# Patient Record
Sex: Female | Born: 1975 | Hispanic: Yes | Marital: Married | Smoking: Never smoker
Health system: Southern US, Community
[De-identification: ages and names within clinical notes are randomized; demographics above are authoritative.]

## PROBLEM LIST (undated history)

## (undated) DIAGNOSIS — R202 Paresthesia of skin: Secondary | ICD-10-CM

## (undated) DIAGNOSIS — M2570 Osteophyte, unspecified joint: Secondary | ICD-10-CM

## (undated) DIAGNOSIS — M9902 Segmental and somatic dysfunction of thoracic region: Secondary | ICD-10-CM

## (undated) DIAGNOSIS — M5413 Radiculopathy, cervicothoracic region: Secondary | ICD-10-CM

## (undated) DIAGNOSIS — M5414 Radiculopathy, thoracic region: Secondary | ICD-10-CM

## (undated) DIAGNOSIS — M9901 Segmental and somatic dysfunction of cervical region: Secondary | ICD-10-CM

## (undated) DIAGNOSIS — M545 Low back pain, unspecified: Secondary | ICD-10-CM

## (undated) DIAGNOSIS — M9903 Segmental and somatic dysfunction of lumbar region: Secondary | ICD-10-CM

## (undated) HISTORY — DX: Paresthesia of skin: R20.2

## (undated) HISTORY — DX: Segmental and somatic dysfunction of thoracic region: M99.02

## (undated) HISTORY — DX: Segmental and somatic dysfunction of cervical region: M99.01

## (undated) HISTORY — DX: Radiculopathy, cervicothoracic region: M54.13

## (undated) HISTORY — DX: Radiculopathy, thoracic region: M54.14

## (undated) HISTORY — DX: Segmental and somatic dysfunction of lumbar region: M99.03

## (undated) HISTORY — DX: Low back pain, unspecified: M54.50

## (undated) HISTORY — DX: Osteophyte, unspecified joint: M25.70

---

## 2011-07-15 ENCOUNTER — Other Ambulatory Visit: Payer: Self-pay | Admitting: Family Medicine

## 2011-07-15 ENCOUNTER — Other Ambulatory Visit (HOSPITAL_COMMUNITY)
Admission: RE | Admit: 2011-07-15 | Discharge: 2011-07-15 | Disposition: A | Discharge status: Home / Self Care | Payer: BC Managed Care – PPO | Source: Ambulatory Visit | Attending: Family Medicine | Admitting: Family Medicine

## 2011-07-15 DIAGNOSIS — Z1159 Encounter for screening for other viral diseases: Secondary | ICD-10-CM | POA: Insufficient documentation

## 2011-07-15 DIAGNOSIS — E049 Nontoxic goiter, unspecified: Secondary | ICD-10-CM

## 2011-07-15 DIAGNOSIS — Z124 Encounter for screening for malignant neoplasm of cervix: Secondary | ICD-10-CM | POA: Insufficient documentation

## 2012-07-17 ENCOUNTER — Other Ambulatory Visit (HOSPITAL_COMMUNITY)
Admission: RE | Admit: 2012-07-17 | Discharge: 2012-07-17 | Disposition: A | Discharge status: Home / Self Care | Payer: BC Managed Care – PPO | Source: Ambulatory Visit | Attending: Family Medicine | Admitting: Family Medicine

## 2012-07-17 ENCOUNTER — Other Ambulatory Visit: Payer: Self-pay | Admitting: Family Medicine

## 2012-07-17 DIAGNOSIS — Z124 Encounter for screening for malignant neoplasm of cervix: Secondary | ICD-10-CM | POA: Insufficient documentation

## 2015-08-15 ENCOUNTER — Other Ambulatory Visit (HOSPITAL_COMMUNITY)
Admission: RE | Admit: 2015-08-15 | Discharge: 2015-08-15 | Disposition: A | Discharge status: Home / Self Care | Payer: BC Managed Care – PPO | Source: Ambulatory Visit | Attending: Family Medicine | Admitting: Family Medicine

## 2015-08-15 ENCOUNTER — Other Ambulatory Visit: Payer: Self-pay | Admitting: Family Medicine

## 2015-08-15 DIAGNOSIS — Z01411 Encounter for gynecological examination (general) (routine) with abnormal findings: Secondary | ICD-10-CM | POA: Diagnosis present

## 2015-08-15 DIAGNOSIS — Z1151 Encounter for screening for human papillomavirus (HPV): Secondary | ICD-10-CM | POA: Insufficient documentation

## 2015-08-16 LAB — CYTOLOGY - PAP

## 2016-01-12 ENCOUNTER — Other Ambulatory Visit: Payer: Self-pay | Admitting: Family Medicine

## 2016-01-12 DIAGNOSIS — Z1231 Encounter for screening mammogram for malignant neoplasm of breast: Secondary | ICD-10-CM

## 2016-01-24 ENCOUNTER — Ambulatory Visit
Admission: RE | Admit: 2016-01-24 | Discharge: 2016-01-24 | Disposition: A | Discharge status: Home / Self Care | Payer: BC Managed Care – PPO | Source: Ambulatory Visit | Attending: Family Medicine | Admitting: Family Medicine

## 2016-01-24 DIAGNOSIS — Z1231 Encounter for screening mammogram for malignant neoplasm of breast: Secondary | ICD-10-CM

## 2017-01-07 ENCOUNTER — Other Ambulatory Visit: Payer: Self-pay | Admitting: Family Medicine

## 2017-01-07 DIAGNOSIS — Z1231 Encounter for screening mammogram for malignant neoplasm of breast: Secondary | ICD-10-CM

## 2017-01-28 ENCOUNTER — Ambulatory Visit
Admission: RE | Admit: 2017-01-28 | Discharge: 2017-01-28 | Disposition: A | Discharge status: Home / Self Care | Payer: BC Managed Care – PPO | Source: Ambulatory Visit | Attending: Family Medicine | Admitting: Family Medicine

## 2017-01-28 DIAGNOSIS — Z1231 Encounter for screening mammogram for malignant neoplasm of breast: Secondary | ICD-10-CM

## 2017-01-29 ENCOUNTER — Other Ambulatory Visit: Payer: Self-pay | Admitting: Family Medicine

## 2017-01-29 DIAGNOSIS — R928 Other abnormal and inconclusive findings on diagnostic imaging of breast: Secondary | ICD-10-CM

## 2017-02-04 ENCOUNTER — Ambulatory Visit
Admission: RE | Admit: 2017-02-04 | Discharge: 2017-02-04 | Disposition: A | Discharge status: Home / Self Care | Payer: BC Managed Care – PPO | Source: Ambulatory Visit | Attending: Family Medicine | Admitting: Family Medicine

## 2017-02-04 DIAGNOSIS — R928 Other abnormal and inconclusive findings on diagnostic imaging of breast: Secondary | ICD-10-CM

## 2018-03-04 ENCOUNTER — Other Ambulatory Visit: Payer: Self-pay | Admitting: Family Medicine

## 2018-03-04 DIAGNOSIS — Z1231 Encounter for screening mammogram for malignant neoplasm of breast: Secondary | ICD-10-CM

## 2018-04-10 ENCOUNTER — Ambulatory Visit
Admission: RE | Admit: 2018-04-10 | Discharge: 2018-04-10 | Disposition: A | Discharge status: Home / Self Care | Payer: BC Managed Care – PPO | Source: Ambulatory Visit | Attending: Family Medicine | Admitting: Family Medicine

## 2018-04-10 DIAGNOSIS — Z1231 Encounter for screening mammogram for malignant neoplasm of breast: Secondary | ICD-10-CM

## 2018-04-14 ENCOUNTER — Other Ambulatory Visit: Payer: Self-pay | Admitting: Family Medicine

## 2018-04-14 DIAGNOSIS — R928 Other abnormal and inconclusive findings on diagnostic imaging of breast: Secondary | ICD-10-CM

## 2018-04-22 ENCOUNTER — Ambulatory Visit
Admission: RE | Admit: 2018-04-22 | Discharge: 2018-04-22 | Disposition: A | Discharge status: Home / Self Care | Payer: BC Managed Care – PPO | Source: Ambulatory Visit | Attending: Family Medicine | Admitting: Family Medicine

## 2018-04-22 DIAGNOSIS — R928 Other abnormal and inconclusive findings on diagnostic imaging of breast: Secondary | ICD-10-CM

## 2019-09-15 ENCOUNTER — Other Ambulatory Visit: Payer: Self-pay | Admitting: Family Medicine

## 2019-09-15 ENCOUNTER — Ambulatory Visit
Admission: RE | Admit: 2019-09-15 | Discharge: 2019-09-15 | Disposition: A | Discharge status: Home / Self Care | Payer: BC Managed Care – PPO | Source: Ambulatory Visit | Attending: Family Medicine | Admitting: Family Medicine

## 2019-09-15 DIAGNOSIS — M549 Dorsalgia, unspecified: Secondary | ICD-10-CM

## 2019-09-15 DIAGNOSIS — G8929 Other chronic pain: Secondary | ICD-10-CM

## 2019-12-28 ENCOUNTER — Other Ambulatory Visit: Payer: Self-pay | Admitting: Family Medicine

## 2019-12-28 DIAGNOSIS — Z1231 Encounter for screening mammogram for malignant neoplasm of breast: Secondary | ICD-10-CM

## 2020-01-28 ENCOUNTER — Ambulatory Visit
Admission: RE | Admit: 2020-01-28 | Discharge: 2020-01-28 | Disposition: A | Discharge status: Home / Self Care | Payer: BC Managed Care – PPO | Source: Ambulatory Visit | Attending: Family Medicine | Admitting: Family Medicine

## 2020-01-28 ENCOUNTER — Other Ambulatory Visit: Payer: Self-pay

## 2020-01-28 DIAGNOSIS — Z1231 Encounter for screening mammogram for malignant neoplasm of breast: Secondary | ICD-10-CM

## 2020-02-09 ENCOUNTER — Other Ambulatory Visit: Payer: Self-pay | Admitting: Chiropractic Medicine

## 2020-02-09 ENCOUNTER — Other Ambulatory Visit: Payer: Self-pay

## 2020-02-09 ENCOUNTER — Ambulatory Visit
Admission: RE | Admit: 2020-02-09 | Discharge: 2020-02-09 | Disposition: A | Discharge status: Home / Self Care | Payer: BC Managed Care – PPO | Source: Ambulatory Visit | Attending: Chiropractic Medicine | Admitting: Chiropractic Medicine

## 2020-02-09 DIAGNOSIS — M549 Dorsalgia, unspecified: Secondary | ICD-10-CM

## 2020-04-19 ENCOUNTER — Other Ambulatory Visit: Payer: Self-pay | Admitting: Chiropractic Medicine

## 2020-04-19 ENCOUNTER — Ambulatory Visit
Admission: RE | Admit: 2020-04-19 | Discharge: 2020-04-19 | Disposition: A | Discharge status: Home / Self Care | Payer: BC Managed Care – PPO | Source: Ambulatory Visit | Attending: Chiropractic Medicine | Admitting: Chiropractic Medicine

## 2020-04-19 ENCOUNTER — Other Ambulatory Visit: Payer: Self-pay

## 2020-04-19 DIAGNOSIS — R208 Other disturbances of skin sensation: Secondary | ICD-10-CM

## 2020-08-01 ENCOUNTER — Encounter: Payer: Self-pay | Admitting: *Deleted

## 2020-08-01 ENCOUNTER — Other Ambulatory Visit: Payer: Self-pay | Admitting: *Deleted

## 2020-08-02 ENCOUNTER — Ambulatory Visit: Payer: BC Managed Care – PPO | Admitting: Diagnostic Neuroimaging

## 2020-09-28 ENCOUNTER — Encounter: Payer: Self-pay | Admitting: *Deleted

## 2020-10-03 ENCOUNTER — Ambulatory Visit: Payer: BC Managed Care – PPO | Admitting: Diagnostic Neuroimaging

## 2020-10-04 ENCOUNTER — Ambulatory Visit: Payer: BC Managed Care – PPO | Admitting: Diagnostic Neuroimaging

## 2020-11-21 ENCOUNTER — Other Ambulatory Visit: Payer: Self-pay

## 2020-11-21 ENCOUNTER — Encounter: Payer: Self-pay | Admitting: Diagnostic Neuroimaging

## 2020-11-21 ENCOUNTER — Ambulatory Visit: Payer: BC Managed Care – PPO | Admitting: Diagnostic Neuroimaging

## 2020-11-21 VITALS — BP 109/71 | HR 100 | Ht 61.0 in | Wt 184.0 lb

## 2020-11-21 DIAGNOSIS — M546 Pain in thoracic spine: Secondary | ICD-10-CM

## 2020-11-21 DIAGNOSIS — G8929 Other chronic pain: Secondary | ICD-10-CM | POA: Diagnosis not present

## 2020-11-21 DIAGNOSIS — R200 Anesthesia of skin: Secondary | ICD-10-CM | POA: Diagnosis not present

## 2020-11-21 NOTE — Progress Notes (Signed)
[facility]  PATIENT: Kayla Wheeler DOB: January 21, 1976  REFERRING CLINICIAN: Geanie Logan, DC HISTORY FROM: patient  REASON FOR VISIT: new consult    HISTORICAL  CHIEF COMPLAINT:  Chief Complaint  Patient presents with   Electric shock sensations, thoracic spine    Rm 7 New Pt  husband- Hildred Alamin  "pain began a year ago, no hx of accident"     HISTORY OF PRESENT ILLNESS:   45 year old female here for valuation of left posterior thoracic pain.  Symptoms started around March 2021.  She saw orthopedic clinic, pain management specialist, chiropractor, physical therapist and PCP.  No specific etiology has been found.  She feels more pain when she is sitting or moving her arm especially overhead.  Is better if she is standing up with her arms at her side.  She has tried gabapentin without relief.  No prodromal accidents injuries or traumas.   REVIEW OF SYSTEMS: Full 14 system review of systems performed and negative with exception of: as per HPI.  ALLERGIES: No Known Allergies  HOME MEDICATIONS: Outpatient Medications Prior to Visit  Medication Sig Dispense Refill   gabapentin (NEURONTIN) 300 MG capsule Take 300 mg by mouth 3 (three) times daily.     No facility-administered medications prior to visit.    PAST MEDICAL HISTORY: Past Medical History:  Diagnosis Date   Lumbar pain    Osteophyte    Paresthesia    Radiculopathy, cervicothoracic region    Radiculopathy, thoracic region    Segmental and somatic dysfunction of cervical region    Segmental and somatic dysfunction of lumbar region    Segmental and somatic dysfunction of thoracic region     PAST SURGICAL HISTORY: No past surgical history on file.  FAMILY HISTORY: Family History  Problem Relation Age of Onset   Heart disease Mother    Breast cancer Neg Hx     SOCIAL HISTORY: Social History   Socioeconomic History   Marital status: Married    Spouse name: Hildred Alamin   Number  of children: 2   Years of education: Not on file   Highest education level: Bachelor's degree (e.g., BA, AB, BS)  Occupational History   Not on file  Tobacco Use   Smoking status: Never   Smokeless tobacco: Never  Substance and Sexual Activity   Alcohol use: Yes    Comment: occas   Drug use: Never   Sexual activity: Not on file  Other Topics Concern   Not on file  Social History Narrative   Lives with husband   Social Determinants of Health   Financial Resource Strain: Not on file  Food Insecurity: Not on file  Transportation Needs: Not on file  Physical Activity: Not on file  Stress: Not on file  Social Connections: Not on file  Intimate Partner Violence: Not on file     PHYSICAL EXAM  GENERAL EXAM/CONSTITUTIONAL: Vitals:  Vitals:   11/21/20 1448  BP: 109/71  Pulse: 100  Weight: 184 lb (83.5 kg)  Height: 5\' 1"  (1.549 m)   Body mass index is 34.77 kg/m. Wt Readings from Last 3 Encounters:  11/21/20 184 lb (83.5 kg)   Patient is in no distress; well developed, nourished and groomed; neck is supple  CARDIOVASCULAR: Examination of carotid arteries is normal; no carotid bruits Regular rate and rhythm, no murmurs Examination of peripheral vascular system by observation and palpation is normal  EYES: Ophthalmoscopic exam of optic discs and posterior segments is normal; no papilledema or  hemorrhages No results found.  MUSCULOSKELETAL: Gait, strength, tone, movements noted in Neurologic exam below  NEUROLOGIC: MENTAL STATUS:  No flowsheet data found. awake, alert, oriented to person, place and time recent and remote memory intact normal attention and concentration language fluent, comprehension intact, naming intact fund of knowledge appropriate  CRANIAL NERVE:  2nd - no papilledema on fundoscopic exam 2nd, 3rd, 4th, 6th - pupils equal and reactive to light, visual fields full to confrontation, extraocular muscles intact, no nystagmus 5th - facial  sensation symmetric 7th - facial strength symmetric 8th - hearing intact 9th - palate elevates symmetrically, uvula midline 11th - shoulder shrug symmetric 12th - tongue protrusion midline  MOTOR:  normal bulk and tone, full strength in the BUE, BLE  SENSORY:  normal and symmetric to light touch, temperature, vibration  COORDINATION:  finger-nose-finger, fine finger movements normal  REFLEXES:  deep tendon reflexes 2+ and symmetric  GAIT/STATION:  narrow based gait     DIAGNOSTIC DATA (LABS, IMAGING, TESTING) - I reviewed patient records, labs, notes, testing and imaging myself where available.  No results found for: WBC, HGB, HCT, MCV, PLT No results found for: NA, K, CL, CO2, GLUCOSE, BUN, CREATININE, CALCIUM, PROT, ALBUMIN, AST, ALT, ALKPHOS, BILITOT, GFRNONAA, GFRAA No results found for: CHOL, HDL, LDLCALC, LDLDIRECT, TRIG, CHOLHDL No results found for: WPYK9X No results found for: VITAMINB12 No results found for: TSH   12/21/19 MRI thoracic spine  - unremarkable     ASSESSMENT AND PLAN  45 y.o. year old female here with chronic pain and numbness in left posterior thoracic region, likely representing thoracic paraspinal and latissimus muscle strain and referred symptoms.  Dx:  1. Chronic left-sided thoracic back pain   2. Numbness       PLAN:  LEFT THORACIC PARASPINAL / LATISSIMUS MUSCLE STRAIN - check B12 level - try acupuncture or massage therapy - start gradual exercise program [facility])  Orders Placed This Encounter  Procedures   Vitamin B12   Return for return to PCP, pending if symptoms worsen or fail to improve, pending test results.    Suanne Marker, MD 11/21/2020, 3:31 PM Certified in Neurology, Neurophysiology and Neuroimaging  [facility] [address], [address] [place], [place] [address] [phone]

## 2020-11-21 NOTE — Patient Instructions (Signed)
-   check B12 level - try acupuncture or massage therapy - start gradual exercise program [facility])

## 2020-11-22 LAB — VITAMIN B12: Vitamin B-12: 433 pg/mL (ref 232–1245)

## 2020-11-23 ENCOUNTER — Telehealth: Payer: Self-pay

## 2020-11-23 NOTE — Telephone Encounter (Signed)
I called the pt and advised of normal lab. Pt advised to call back if she had any questions.

## 2020-11-23 NOTE — Telephone Encounter (Signed)
-----   Message from Suanne Marker, MD sent at 11/22/2020  5:25 PM EDT ----- Normal labs. Please call patient. -VRP

## 2021-04-19 ENCOUNTER — Other Ambulatory Visit: Payer: Self-pay | Admitting: Orthopedic Surgery

## 2021-04-19 DIAGNOSIS — M5030 Other cervical disc degeneration, unspecified cervical region: Secondary | ICD-10-CM

## 2021-04-19 DIAGNOSIS — M4802 Spinal stenosis, cervical region: Secondary | ICD-10-CM

## 2021-05-06 ENCOUNTER — Other Ambulatory Visit: Payer: BC Managed Care – PPO

## 2021-06-27 ENCOUNTER — Other Ambulatory Visit: Payer: Self-pay | Admitting: Family Medicine

## 2021-06-27 DIAGNOSIS — Z1231 Encounter for screening mammogram for malignant neoplasm of breast: Secondary | ICD-10-CM

## 2021-07-11 ENCOUNTER — Ambulatory Visit
Admission: RE | Admit: 2021-07-11 | Discharge: 2021-07-11 | Disposition: A | Discharge status: Home / Self Care | Payer: BC Managed Care – PPO | Source: Ambulatory Visit

## 2021-07-11 DIAGNOSIS — Z1231 Encounter for screening mammogram for malignant neoplasm of breast: Secondary | ICD-10-CM

## 2021-07-20 IMAGING — CR DG PELVIS 1-2V
1 series · 1 of 1 positions shown · non-contrast
Comparison: None.

CLINICAL DATA: Back pain.

EXAM:
PELVIS - 1-2 VIEW

[w pelvis upright]
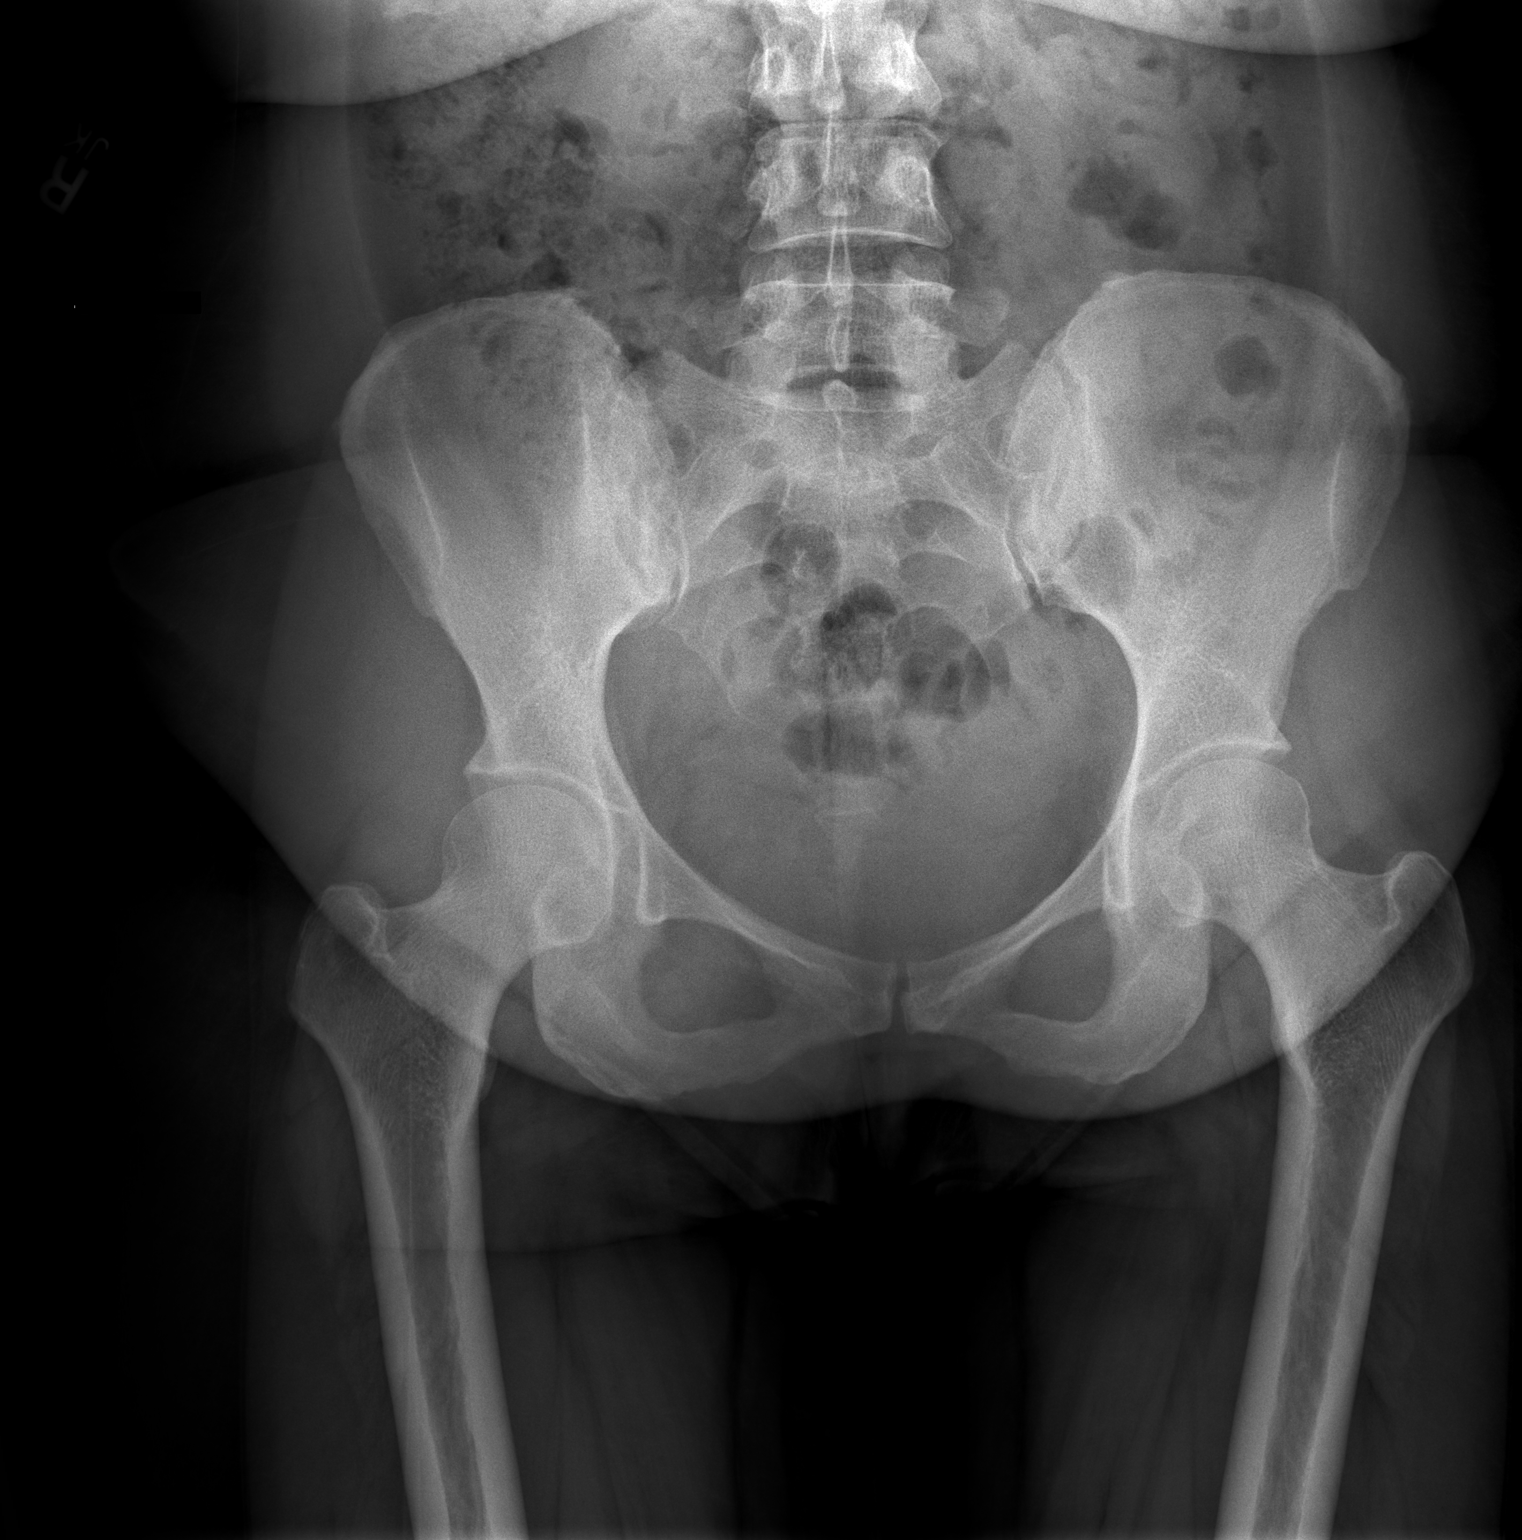

[1 of 1 positions shown; findings below may reference images not displayed]

FINDINGS: Both hips are normally located. No degenerative changes, fracture or
plain film evidence of AVN.

The pubic symphysis and SI joints are intact. No pelvic fractures or
bone lesions.
IMPRESSION: No acute bony findings or degenerative changes.

## 2021-07-20 IMAGING — CR DG CERVICAL SPINE COMPLETE 4+V
5 series · 5 of 5 positions shown · non-contrast
Comparison: None.

CLINICAL DATA: Neck pain for 4 months.

EXAM:
CERVICAL SPINE - COMPLETE 4+ VIEW

[w cervical spine lat]
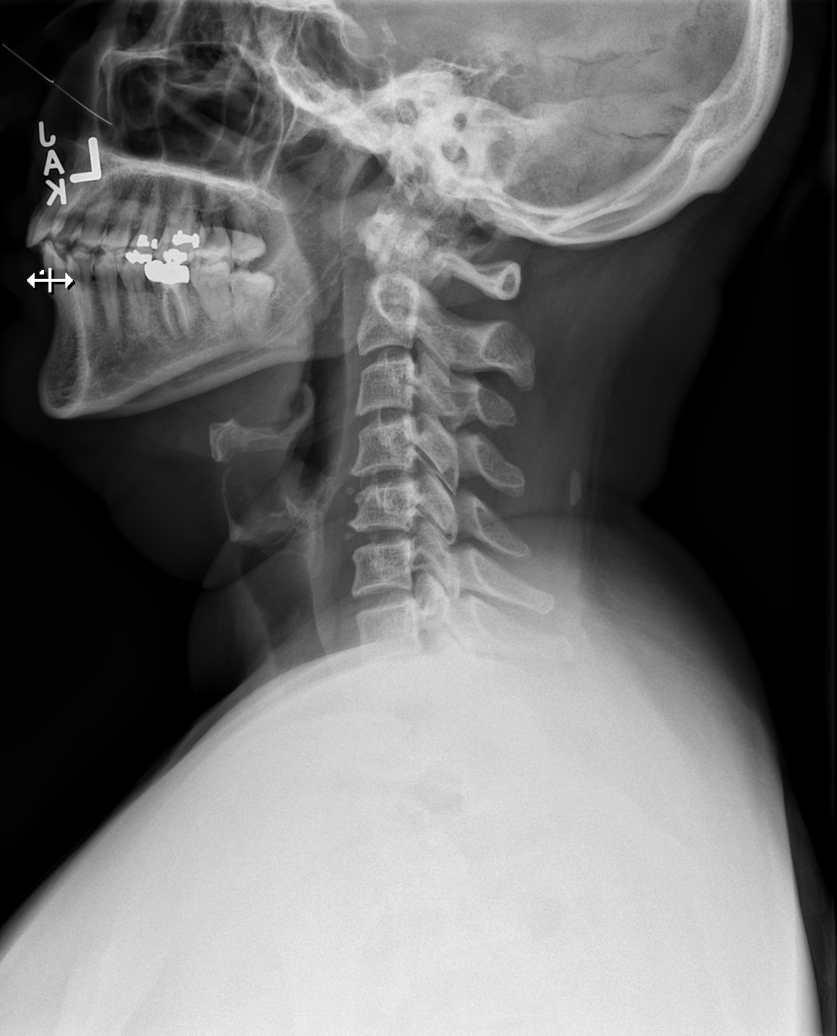

[w cervical spine ap_obl (1 of 2)]
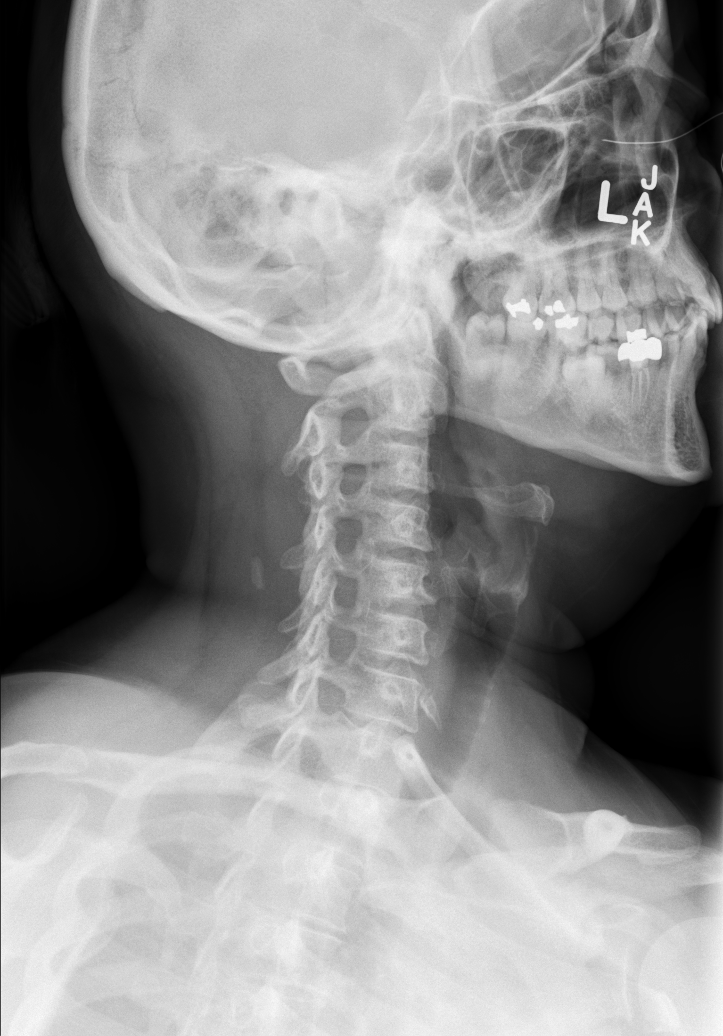

[w cervical spine ap_obl (2 of 2)]
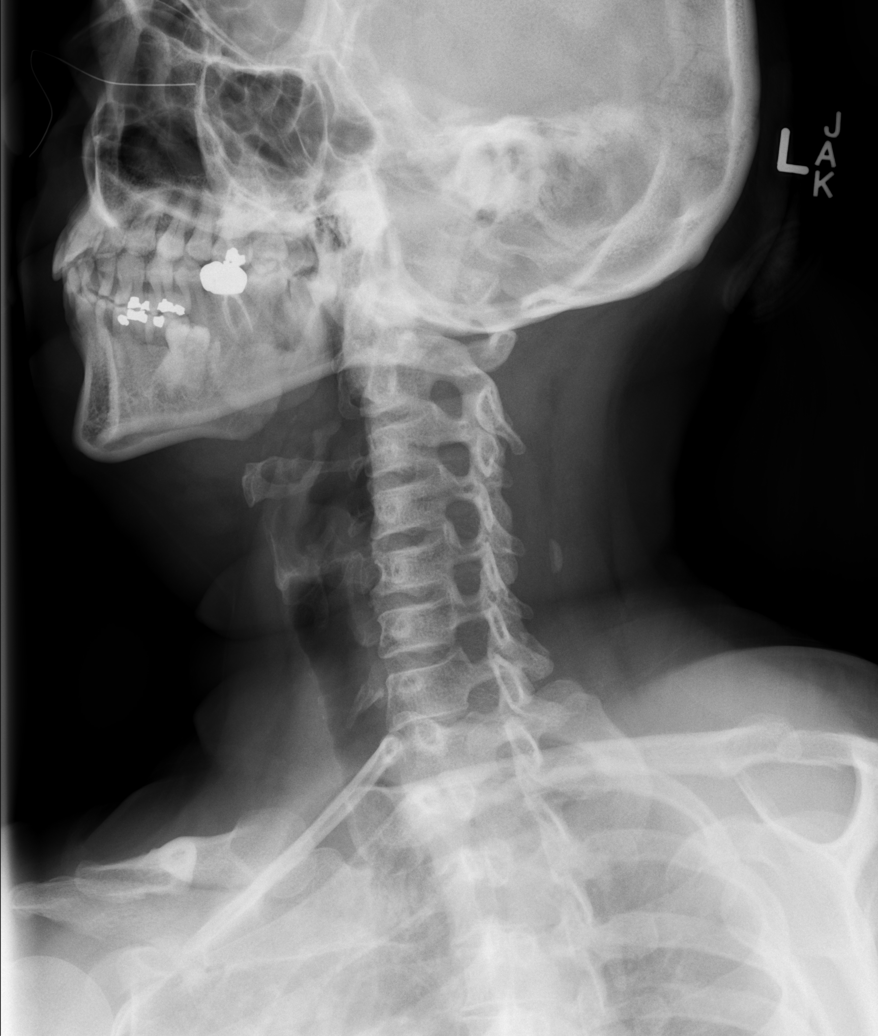

[w cervical spine ap]
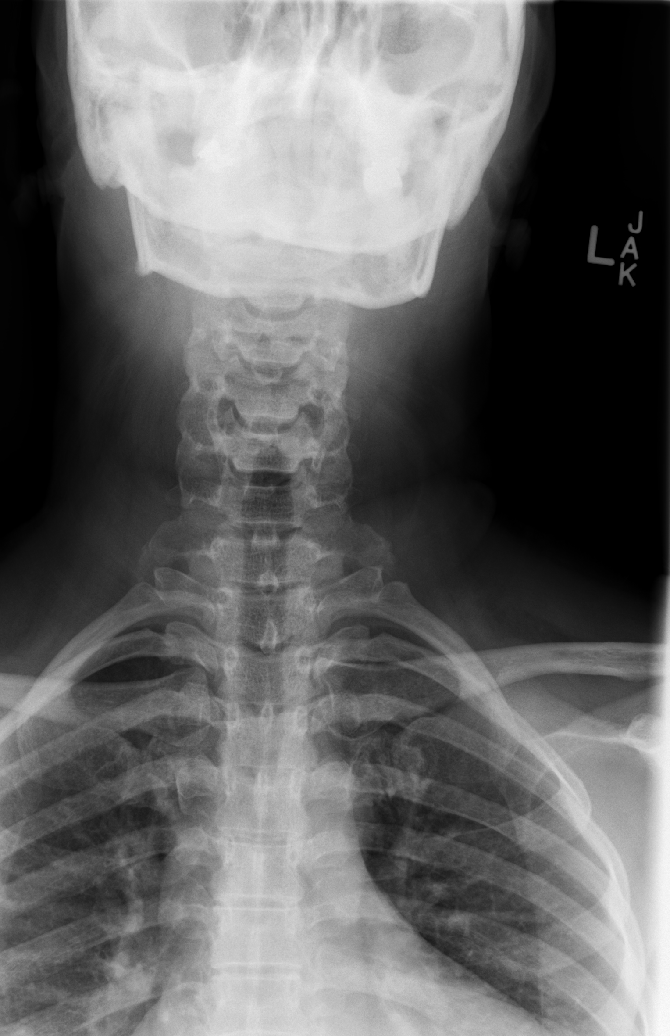

[w cervical spine odontoid]
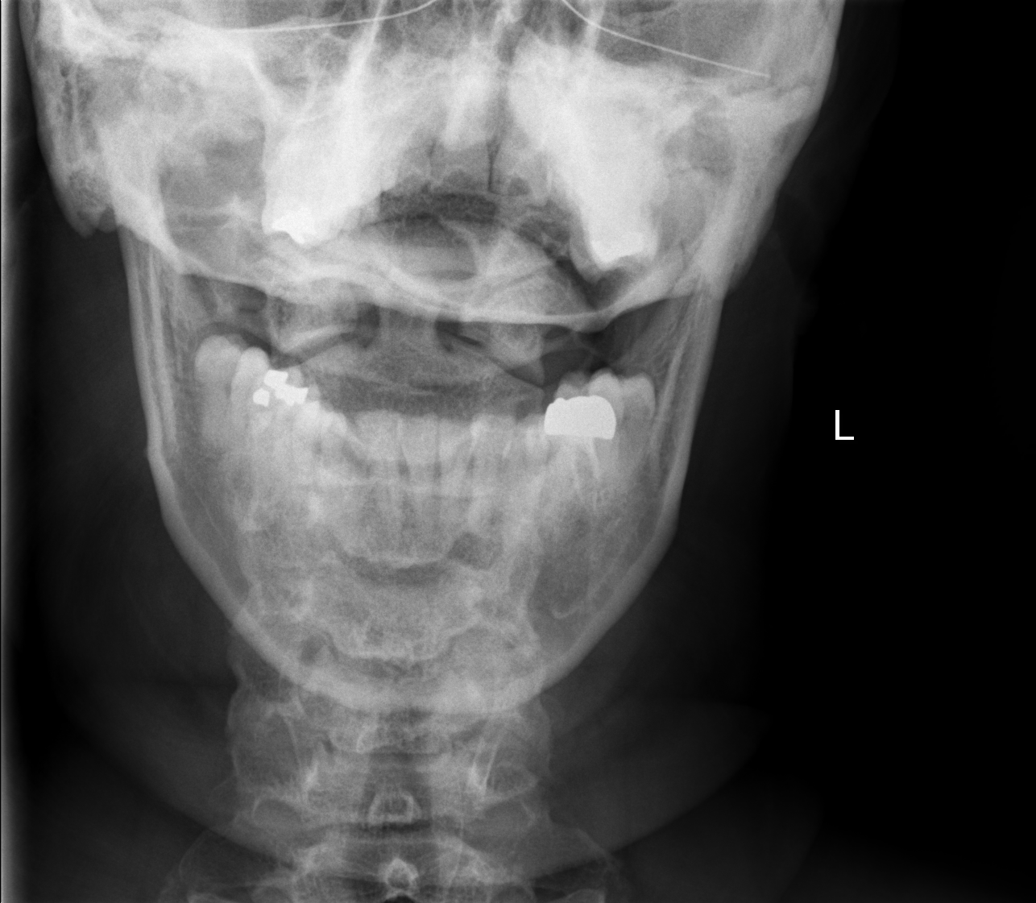

[5 of 5 positions shown; findings below may reference images not displayed]

FINDINGS: Normal alignment of the cervical vertebral bodies. No acute bony
findings or abnormal prevertebral soft tissue swelling. Disc spaces
are fairly well preserved. Minimal/mild degenerative changes mainly
at C4-5 and C5-6. The facets are normally aligned. No significant
degenerative changes. Calcification noted in the posterior
longitudinal ligament at the C4 level could be related to old
trauma.

The oblique films demonstrate widely patent bony neural foramen. The
C1-2 articulations are maintained.

Small bilateral cervical ribs are noted.  The lung apices are clear.
IMPRESSION: 1. Normal alignment and no acute bony findings.
2. Minimal/mild degenerative changes.
3. Small bilateral cervical ribs.

## 2022-08-02 ENCOUNTER — Other Ambulatory Visit: Payer: Self-pay | Admitting: Family Medicine

## 2022-08-02 DIAGNOSIS — Z1231 Encounter for screening mammogram for malignant neoplasm of breast: Secondary | ICD-10-CM

## 2022-09-17 ENCOUNTER — Ambulatory Visit
Admission: RE | Admit: 2022-09-17 | Discharge: 2022-09-17 | Disposition: A | Discharge status: Home / Self Care | Payer: BC Managed Care – PPO | Source: Ambulatory Visit

## 2022-09-17 DIAGNOSIS — Z1231 Encounter for screening mammogram for malignant neoplasm of breast: Secondary | ICD-10-CM

## 2023-10-02 ENCOUNTER — Other Ambulatory Visit: Payer: Self-pay | Admitting: Family Medicine

## 2023-10-02 DIAGNOSIS — Z1231 Encounter for screening mammogram for malignant neoplasm of breast: Secondary | ICD-10-CM

## 2023-10-20 ENCOUNTER — Ambulatory Visit
Admission: RE | Admit: 2023-10-20 | Discharge: 2023-10-20 | Disposition: A | Discharge status: Home / Self Care | Source: Ambulatory Visit

## 2023-10-20 DIAGNOSIS — Z1231 Encounter for screening mammogram for malignant neoplasm of breast: Secondary | ICD-10-CM
